# Patient Record
Sex: Female | Born: 1970 | Race: Black or African American | Hispanic: No | Marital: Married | Smoking: Never smoker
Health system: Southern US, Community
[De-identification: ages and names within clinical notes are randomized; demographics above are authoritative.]

## PROBLEM LIST (undated history)

## (undated) DIAGNOSIS — I10 Essential (primary) hypertension: Secondary | ICD-10-CM

---

## 2003-02-19 ENCOUNTER — Other Ambulatory Visit: Admission: RE | Admit: 2003-02-19 | Discharge: 2003-02-19 | Payer: Self-pay | Admitting: Obstetrics and Gynecology

## 2010-05-04 ENCOUNTER — Encounter: Payer: Self-pay | Admitting: Obstetrics and Gynecology

## 2010-05-06 ENCOUNTER — Ambulatory Visit (INDEPENDENT_AMBULATORY_CARE_PROVIDER_SITE_OTHER)
Admission: RE | Admit: 2010-05-06 | Discharge: 2010-05-06 | Payer: Self-pay | Source: Home / Self Care | Attending: Obstetrics and Gynecology | Admitting: Obstetrics and Gynecology

## 2010-05-06 DIAGNOSIS — Z1231 Encounter for screening mammogram for malignant neoplasm of breast: Secondary | ICD-10-CM

## 2011-06-15 ENCOUNTER — Other Ambulatory Visit (HOSPITAL_COMMUNITY)
Admission: RE | Admit: 2011-06-15 | Discharge: 2011-06-15 | Disposition: A | Discharge status: Home / Self Care | Payer: BC Managed Care – PPO | Source: Ambulatory Visit | Attending: Obstetrics and Gynecology | Admitting: Obstetrics and Gynecology

## 2011-06-15 ENCOUNTER — Other Ambulatory Visit: Payer: Self-pay | Admitting: Obstetrics and Gynecology

## 2011-06-15 DIAGNOSIS — Z1231 Encounter for screening mammogram for malignant neoplasm of breast: Secondary | ICD-10-CM

## 2011-06-15 DIAGNOSIS — Z124 Encounter for screening for malignant neoplasm of cervix: Secondary | ICD-10-CM | POA: Insufficient documentation

## 2011-06-22 ENCOUNTER — Ambulatory Visit (HOSPITAL_BASED_OUTPATIENT_CLINIC_OR_DEPARTMENT_OTHER): Payer: Self-pay

## 2013-09-11 ENCOUNTER — Ambulatory Visit (HOSPITAL_BASED_OUTPATIENT_CLINIC_OR_DEPARTMENT_OTHER)
Admission: RE | Admit: 2013-09-11 | Discharge: 2013-09-11 | Disposition: A | Discharge status: Home / Self Care | Payer: BC Managed Care – PPO | Source: Ambulatory Visit | Attending: Obstetrics and Gynecology | Admitting: Obstetrics and Gynecology

## 2013-09-11 ENCOUNTER — Other Ambulatory Visit: Payer: Self-pay | Admitting: Obstetrics and Gynecology

## 2013-09-11 ENCOUNTER — Encounter (INDEPENDENT_AMBULATORY_CARE_PROVIDER_SITE_OTHER): Payer: Self-pay

## 2013-09-11 DIAGNOSIS — Z1231 Encounter for screening mammogram for malignant neoplasm of breast: Secondary | ICD-10-CM | POA: Insufficient documentation

## 2015-01-09 ENCOUNTER — Emergency Department (HOSPITAL_COMMUNITY)
Admission: EM | Admit: 2015-01-09 | Discharge: 2015-01-09 | Disposition: A | Discharge status: Home / Self Care | Payer: BC Managed Care – PPO | Attending: Emergency Medicine | Admitting: Emergency Medicine

## 2015-01-09 ENCOUNTER — Emergency Department (HOSPITAL_COMMUNITY): Payer: BC Managed Care – PPO

## 2015-01-09 ENCOUNTER — Encounter (HOSPITAL_COMMUNITY): Payer: Self-pay | Admitting: Emergency Medicine

## 2015-01-09 DIAGNOSIS — Y9361 Activity, american tackle football: Secondary | ICD-10-CM | POA: Insufficient documentation

## 2015-01-09 DIAGNOSIS — S62636A Displaced fracture of distal phalanx of right little finger, initial encounter for closed fracture: Secondary | ICD-10-CM | POA: Diagnosis not present

## 2015-01-09 DIAGNOSIS — W230XXA Caught, crushed, jammed, or pinched between moving objects, initial encounter: Secondary | ICD-10-CM | POA: Insufficient documentation

## 2015-01-09 DIAGNOSIS — I10 Essential (primary) hypertension: Secondary | ICD-10-CM | POA: Diagnosis not present

## 2015-01-09 DIAGNOSIS — S6991XA Unspecified injury of right wrist, hand and finger(s), initial encounter: Secondary | ICD-10-CM | POA: Diagnosis present

## 2015-01-09 DIAGNOSIS — Y998 Other external cause status: Secondary | ICD-10-CM | POA: Diagnosis not present

## 2015-01-09 DIAGNOSIS — Y9289 Other specified places as the place of occurrence of the external cause: Secondary | ICD-10-CM | POA: Insufficient documentation

## 2015-01-09 DIAGNOSIS — S62639A Displaced fracture of distal phalanx of unspecified finger, initial encounter for closed fracture: Secondary | ICD-10-CM

## 2015-01-09 HISTORY — DX: Essential (primary) hypertension: I10

## 2015-01-09 NOTE — ED Notes (Signed)
Pt c/o right 5 digit injury last Friday, mild deformity noted to same, rates pain 6/10, non radiating, reports numbness to same.

## 2015-01-09 NOTE — Discharge Instructions (Signed)
Please keep splint on you finger for 3-4 weeks. You may use ibuprofen and ice for pain relief. Please follow up with orthopedics in 1 week. Please return to the Emergency Department if symptoms worsen.

## 2015-01-09 NOTE — ED Provider Notes (Signed)
CSN: [id number]     Arrival date & time 01/09/15  1421 History  This chart was scribed for non-physician practitioner, Barrett Henle, PA-C working with Elwin Mocha, MD by Placido Sou, ED scribe. This patient was seen in room WTR6/WTR6 and the patient's care was started at 2:57 PM.   Chief Complaint  Patient presents with  . Finger Injury   The history is provided by the patient. No language interpreter was used.    HPI Comments: Marcia Kemp is a 44 y.o. female who presents to the Emergency Department complaining of constant, moderate, right pinky pain with onset 5 days ago. Pt notes that her finger was jammed with a football while playing catch and due to her pain not subsiding came to the ED for evaluation. Pt notes some associated, mild, swelling which has somewhat subsided. Pt notes a worsening of her pain with movement or palpation. She notes applying ice to the affected region and taking NSAIDs as needed which has provided moderate relief. Denies numbness, tingling. She confirms her medical history. Pt denies any other associated symptoms.   Past Medical History  Diagnosis Date  . Hypertension    History reviewed. No pertinent past surgical history. No family history on file. Social History  Substance Use Topics  . Smoking status: Never Smoker   . Smokeless tobacco: None  . Alcohol Use: No   OB History    No data available     Review of Systems  Musculoskeletal: Positive for joint swelling and arthralgias.  Skin: Negative for color change and wound.   Allergies  Review of patient's allergies indicates no known allergies.  Home Medications   Prior to Admission medications   Not on File   BP 165/87 mmHg  Pulse 60  Temp(Src) 97.6 F (36.4 C) (Oral)  Resp 17  SpO2 96%  LMP 12/30/2014 Physical Exam  Constitutional: She is oriented to person, place, and time. She appears well-developed and well-nourished. No distress.  HENT:  Head: Normocephalic and  atraumatic.  Mouth/Throat: No oropharyngeal exudate.  Neck: Normal range of motion. No tracheal deviation present.  Cardiovascular: Normal rate and intact distal pulses.   Pulses:      Radial pulses are 2+ on the right side.  Pulmonary/Chest: Effort normal. No respiratory distress.  Abdominal: Soft. There is no tenderness.  Musculoskeletal: She exhibits tenderness.  Right 5th digit at DIP joint resting in partially flexed position; no swelling, erythema; mild tenderness at dorsal aspect of DIP joint; DROM of DIP joint due to pain; no bony deformity; FROM of PIP and MCP joints; sensation intact; cap refill less than 2 seconds  Neurological: She is alert and oriented to person, place, and time.  Skin: Skin is warm and dry. She is not diaphoretic.  Psychiatric: She has a normal mood and affect. Her behavior is normal.  Nursing note and vitals reviewed.  ED Course  Procedures  DIAGNOSTIC STUDIES: Oxygen Saturation is 96% on RA, normal by my interpretation.    COORDINATION OF CARE: 3:04 PM Discussed treatment plan with pt at bedside including an x-ray of the affected finger. Pt agreed to plan.  Labs Review Labs Reviewed - No data to display  Imaging Review Dg Finger Little Right  01/09/2015   CLINICAL DATA:  Football injury to the right fifth finger with pain.  EXAM: RIGHT LITTLE FINGER 2+V  COMPARISON:  None.  FINDINGS: There is an intra-articular dorsal plate fracture of the distal phalanx of the right fifth finger, with 2  mm dorsal displacement of the dorsal plate fracture fragment, which comprises approximately 40% of the articular surface. There is subtle lucency at the volar plate of the middle phalanx adjacent to the proximal interphalangeal joint in the right fifth finger on the lateral view, and an nondisplaced volar plate fracture of the middle phalanx cannot be excluded. No dislocation or suspicious focal osseous lesion. Otherwise normal joint spaces.  IMPRESSION: 1. Minimally  displaced intra-articular dorsal plate fracture of the distal phalanx in the right fifth finger. 2. Subtle lucency at the volar plate of the middle phalanx at the PIP joint in the right fifth finger on the lateral view, cannot exclude a nondisplaced middle phalanx volar plate fracture in the right fifth finger.   Electronically Signed   By: Delbert Phenix M.D.   On: 01/09/2015 15:53   I have personally reviewed and evaluated these images and lab results as part of my medical decision-making.  Filed Vitals:   01/09/15 1435  BP: 165/87  Pulse: 60  Temp: 97.6 F (36.4 C)  Resp: 17     MDM   Final diagnoses:  Phalanx, distal fracture of finger, closed, initial encounter   Pt presents with right 5th digit pain s/p injury. Decreased swelling since injury with ice and NSAIDs but pain has remained. VSS. Right 5th DIP joint resting in partially flexed position, DROM of DIP joint due to pain, no swelling, neurovascularly intact. Ice place injured finger. Xray revealed fx of distal phalanx. No evidence of open fracture on exam. Splint placed on finger with DIP and PIP joint in extension. Plan to d/c pt home. Pt given ortho follow up. Pt advised to continue using ice and NASIDs for pain control.   Evaluation does not show pathology requring ongoing emergent intervention or admission. Pt is hemodynamically stable and mentating appropriately. Discussed findings/results and plan with patient/guardian, who agrees with plan. All questions answered. Return precautions discussed and outpatient follow up given.   I personally performed the services described in this documentation, which was scribed in my presence. The recorded information has been reviewed and is accurate.    Satira Sark [place], [place] 01/10/15 1018  Elwin Mocha, MD 01/10/15 [phone]

## 2015-01-09 NOTE — ED Notes (Signed)
Ortho paged for splint 

## 2015-03-15 ENCOUNTER — Other Ambulatory Visit (HOSPITAL_BASED_OUTPATIENT_CLINIC_OR_DEPARTMENT_OTHER): Payer: Self-pay | Admitting: Family Medicine

## 2015-03-15 DIAGNOSIS — Z1231 Encounter for screening mammogram for malignant neoplasm of breast: Secondary | ICD-10-CM

## 2015-03-18 ENCOUNTER — Ambulatory Visit (HOSPITAL_BASED_OUTPATIENT_CLINIC_OR_DEPARTMENT_OTHER)
Admission: RE | Admit: 2015-03-18 | Discharge: 2015-03-18 | Disposition: A | Discharge status: Home / Self Care | Payer: BC Managed Care – PPO | Source: Ambulatory Visit | Attending: Family Medicine | Admitting: Family Medicine

## 2015-03-18 ENCOUNTER — Ambulatory Visit (HOSPITAL_BASED_OUTPATIENT_CLINIC_OR_DEPARTMENT_OTHER): Payer: BC Managed Care – PPO

## 2015-03-18 DIAGNOSIS — Z1231 Encounter for screening mammogram for malignant neoplasm of breast: Secondary | ICD-10-CM | POA: Diagnosis present

## 2016-09-23 ENCOUNTER — Other Ambulatory Visit (HOSPITAL_BASED_OUTPATIENT_CLINIC_OR_DEPARTMENT_OTHER): Payer: Self-pay | Admitting: Family Medicine

## 2016-09-23 DIAGNOSIS — Z1231 Encounter for screening mammogram for malignant neoplasm of breast: Secondary | ICD-10-CM

## 2016-09-24 ENCOUNTER — Ambulatory Visit (HOSPITAL_BASED_OUTPATIENT_CLINIC_OR_DEPARTMENT_OTHER)
Admission: RE | Admit: 2016-09-24 | Discharge: 2016-09-24 | Disposition: A | Discharge status: Home / Self Care | Payer: BC Managed Care – PPO | Source: Ambulatory Visit | Attending: Family Medicine | Admitting: Family Medicine

## 2016-09-24 DIAGNOSIS — Z1231 Encounter for screening mammogram for malignant neoplasm of breast: Secondary | ICD-10-CM

## 2018-04-17 ENCOUNTER — Emergency Department (HOSPITAL_COMMUNITY)
Admission: EM | Admit: 2018-04-17 | Discharge: 2018-04-17 | Disposition: A | Discharge status: Home / Self Care | Payer: BC Managed Care – PPO | Attending: Emergency Medicine | Admitting: Emergency Medicine

## 2018-04-17 ENCOUNTER — Encounter (HOSPITAL_COMMUNITY): Payer: Self-pay | Admitting: *Deleted

## 2018-04-17 ENCOUNTER — Other Ambulatory Visit: Payer: Self-pay

## 2018-04-17 DIAGNOSIS — F419 Anxiety disorder, unspecified: Secondary | ICD-10-CM

## 2018-04-17 DIAGNOSIS — R42 Dizziness and giddiness: Secondary | ICD-10-CM | POA: Diagnosis not present

## 2018-04-17 DIAGNOSIS — R51 Headache: Secondary | ICD-10-CM | POA: Insufficient documentation

## 2018-04-17 DIAGNOSIS — R519 Headache, unspecified: Secondary | ICD-10-CM

## 2018-04-17 LAB — URINALYSIS, ROUTINE W REFLEX MICROSCOPIC
Bilirubin Urine: NEGATIVE
GLUCOSE, UA: NEGATIVE mg/dL
HGB URINE DIPSTICK: NEGATIVE
Ketones, ur: 5 mg/dL — AB
LEUKOCYTES UA: NEGATIVE
Nitrite: NEGATIVE
PH: 5 (ref 5.0–8.0)
PROTEIN: NEGATIVE mg/dL
Specific Gravity, Urine: 1.013 (ref 1.005–1.030)

## 2018-04-17 LAB — CBC WITH DIFFERENTIAL/PLATELET
Abs Immature Granulocytes: 0.02 10*3/uL (ref 0.00–0.07)
BASOS ABS: 0.1 10*3/uL (ref 0.0–0.1)
BASOS PCT: 1 %
EOS ABS: 0.1 10*3/uL (ref 0.0–0.5)
Eosinophils Relative: 1 %
HCT: 35.6 % — ABNORMAL LOW (ref 36.0–46.0)
Hemoglobin: 11.2 g/dL — ABNORMAL LOW (ref 12.0–15.0)
Immature Granulocytes: 0 %
LYMPHS ABS: 2.3 10*3/uL (ref 0.7–4.0)
Lymphocytes Relative: 30 %
MCH: 28.5 pg (ref 26.0–34.0)
MCHC: 31.5 g/dL (ref 30.0–36.0)
MCV: 90.6 fL (ref 80.0–100.0)
Monocytes Absolute: 0.7 10*3/uL (ref 0.1–1.0)
Monocytes Relative: 9 %
NEUTROS PCT: 59 %
NRBC: 0 % (ref 0.0–0.2)
Neutro Abs: 4.7 10*3/uL (ref 1.7–7.7)
PLATELETS: 216 10*3/uL (ref 150–400)
RBC: 3.93 MIL/uL (ref 3.87–5.11)
RDW: 14.3 % (ref 11.5–15.5)
WBC: 7.9 10*3/uL (ref 4.0–10.5)

## 2018-04-17 LAB — BASIC METABOLIC PANEL
ANION GAP: 9 (ref 5–15)
BUN: 11 mg/dL (ref 6–20)
CALCIUM: 9.9 mg/dL (ref 8.9–10.3)
CO2: 26 mmol/L (ref 22–32)
CREATININE: 0.75 mg/dL (ref 0.44–1.00)
Chloride: 104 mmol/L (ref 98–111)
Glucose, Bld: 106 mg/dL — ABNORMAL HIGH (ref 70–99)
Potassium: 3.6 mmol/L (ref 3.5–5.1)
Sodium: 139 mmol/L (ref 135–145)

## 2018-04-17 LAB — I-STAT BETA HCG BLOOD, ED (MC, WL, AP ONLY): I-stat hCG, quantitative: <5 m[IU]/mL (ref <5)

## 2018-04-17 LAB — CK: CK TOTAL: 56 U/L (ref 38–234)

## 2018-04-17 MED ORDER — HYDROXYZINE HCL 25 MG PO TABS: 25.0000 mg | ORAL_TABLET | Freq: Four times a day (QID) | ORAL | 0 refills | Status: AC | PRN

## 2018-04-17 NOTE — ED Provider Notes (Signed)
[facility]-EMERGENCY DEPT Provider Note   CSN: [id number] Arrival date & time: 04/17/18  0569   History   Chief Complaint Chief Complaint  Patient presents with  . Headache  . Dizziness    HPI Marcia Kemp is a 48 y.o. female with past medical history significant for hypertension, anxiety who presents for evaluation of intermittent headaches and dizziness.  Patient states she has been having intermittent headaches, dizziness over the last 6 months.  Patient states she does not have a headache or dizziness at this time.  Was seen by her PCP and was told this was her anxiety causing symptoms.  States she has had intermittent lightheadedness.  Patient states that she thought she saw "sparkling lights" when she turned her head quickly.  Denies fever, chills, nausea, vomiting, vision changes, eye pain, neck stiffness, neck rigidity, chest pain, shortness of breath, cough, abdominal pain, dysuria, diarrhea, constipation, unilateral weakness, facial asymmetry.  Patient states she also has had intermittent "jumping sensations all over my body."  States these have been occurring for the last 8 months.  States she has also had lower extremity cramping.  Has been having normal p.o. intake.  Patient is concerned "I may be dehydrated."  Patient was recently started on trazodone by her PCP because she has not been sleeping well at night.  Does not take anything during the day for anxiety.  History obtained from patient.  No interpreter was used.  HPI  Past Medical History:  Diagnosis Date  . Hypertension     There are no active problems to display for this patient.   History reviewed. No pertinent surgical history.   OB History   No obstetric history on file.      Home Medications    Prior to Admission medications   Medication Sig Start Date End Date Taking? Authorizing Provider  hydrochlorothiazide (HYDRODIURIL) 12.5 MG tablet Take 12.5 mg by mouth daily.  09/25/14    [provider]  hydrOXYzine (ATARAX/VISTARIL) 25 MG tablet Take 1 tablet (25 mg total) by mouth every 6 (six) hours as needed for anxiety. 04/17/18   ,  A, PA-C  ibuprofen (ADVIL,MOTRIN) 200 MG tablet Take 400 mg by mouth every 6 (six) hours as needed for moderate pain.    [provider]  Vitamin D, Ergocalciferol, (DRISDOL) 50000 UNITS CAPS capsule Take 50,000 Units by mouth every 7 (seven) days. Take on Wednesdays. 10/31/14   [provider]    Family History No family history on file.  Social History Social History   Tobacco Use  . Smoking status: Never Smoker  Substance Use Topics  . Alcohol use: No  . Drug use: No     Allergies   Patient has no known allergies.   Review of Systems Review of Systems  Constitutional: Negative.   HENT: Negative.   Respiratory: Negative.   Cardiovascular: Negative.   Gastrointestinal: Negative.   Musculoskeletal:       Diffuse muscle cramping and muscle twitching.  Skin: Negative.   Neurological: Positive for dizziness and headaches. Negative for tremors, seizures, syncope, facial asymmetry, speech difficulty, weakness, light-headedness and numbness.  All other systems reviewed and are negative.    Physical Exam Updated Vital Signs BP (!) 147/76   Pulse (!) 56   Temp 98.2 F (36.8 C) (Oral)   Resp 15   Ht 5\' 2"  (1.575 m)   Wt 57.2 kg   LMP 04/05/2018 (Exact Date)   SpO2 100%   BMI 23.05  kg/m   Physical Exam Vitals signs and nursing note reviewed.  Constitutional:      General: She is not in acute distress.    Appearance: She is well-developed. She is not ill-appearing, toxic-appearing or diaphoretic.  HENT:     Head: Normocephalic and atraumatic.     Mouth/Throat:     Mouth: Mucous membranes are moist.  Eyes:     Pupils: Pupils are equal, round, and reactive to light.     Comments: No horizontal, vertical or rotational nystagmus   Neck:     Musculoskeletal: Normal range of  motion.     Comments: Full active and passive ROM without pain No midline or paraspinal tenderness No nuchal rigidity or meningeal signs  Cardiovascular:     Rate and Rhythm: Normal rate.     Heart sounds: Normal heart sounds. No murmur. No friction rub. No gallop.   Pulmonary:     Effort: Pulmonary effort is normal. No respiratory distress.     Breath sounds: Normal breath sounds.     Comments: Clear to auscultation bilaterally without wheeze, rhonchi or rales. Abdominal:     General: Bowel sounds are normal. There is no distension.     Palpations: Abdomen is soft.     Tenderness: There is no abdominal tenderness.     Comments: Soft, nontender without rebound or guarding.  Musculoskeletal: Normal range of motion.     Comments: Moves all extremities without difficulty.  Gait without ataxia.  Skin:    General: Skin is warm and dry.  Neurological:     Mental Status: She is alert.     Comments: Mental Status:  Alert, oriented, thought content appropriate. Speech fluent without evidence of aphasia. Able to follow 2 step commands without difficulty.  Cranial Nerves:  II:  Peripheral visual fields grossly normal, pupils equal, round, reactive to light III,IV, VI: ptosis not present, extra-ocular motions intact bilaterally  V,VII: smile symmetric, facial light touch sensation equal VIII: hearing grossly normal bilaterally  IX,X: midline uvula rise  XI: bilateral shoulder shrug equal and strong XII: midline tongue extension  Motor:  5/5 in upper and lower extremities bilaterally including strong and equal grip strength and dorsiflexion/plantar flexion Sensory: Pinprick and light touch normal in all extremities.  Deep Tendon Reflexes: 2+ and symmetric  Cerebellar: normal finger-to-nose with bilateral upper extremities Gait: normal gait and balance CV: distal pulses palpable throughout    Psychiatric:     Comments: Patient is anxious on exam.    ED Treatments / Results  Labs (all  labs ordered are listed, but only abnormal results are displayed) Labs Reviewed  CBC WITH DIFFERENTIAL/PLATELET - Abnormal; Notable for the following components:      Result Value   Hemoglobin 11.2 (*)    HCT 35.6 (*)    All other components within normal limits  BASIC METABOLIC PANEL - Abnormal; Notable for the following components:   Glucose, Bld 106 (*)    All other components within normal limits  URINALYSIS, ROUTINE W REFLEX MICROSCOPIC - Abnormal; Notable for the following components:   Color, Urine STRAW (*)    Ketones, ur 5 (*)    All other components within normal limits  CK  I-STAT BETA HCG BLOOD, ED ([facility], [facility], [facility] ONLY)    EKG EKG Interpretation  Date/Time:  Sunday April 17 2018 22:00:23 EST Ventricular Rate:  54 PR Interval:    QRS Duration: 76 QT Interval:  400 QTC Calculation: 379 R Axis:   35 Text  Interpretation:  Sinus rhythm T wave abnormality Baseline wander Abnormal ekg Confirmed by Gerhard MunchLockwood, Robert [phone]([id number]) on 04/17/2018 10:54:59 PM   Radiology No results found.  Procedures Procedures (including critical care time)  Medications Ordered in ED Medications - No data to display   Initial Impression / Assessment and Plan / ED Course  I have reviewed the triage vital signs and the nursing notes.  Pertinent labs & imaging results that were available during my care of the patient were reviewed by me and considered in my medical decision making (see chart for details).  48 year old female who appears otherwise well presents for evaluation of multiple complaints.  Afebrile, nonseptic, non-ill-appearing.  Patient has been having intermittent headaches, dizziness, muscle cramping as well as "muscle jumping."  Patient has been followed by her PCP for the symptoms.  Patient states her primary care provider told her she was having anxiety.  Was also having difficulty sleeping at night was recently started on trazodone.  Patient states she does have a history of anxiety,  panic attacks.  Does not take anything for her symptoms.  Nonfocal neurologic exam without neurologic deficits.  Lungs clear to auscultation bilaterally.  Negative orthostatic VS.  Low suspicion for emergent etiology of symptoms at this time.  She is anxious on exam.  Will obtain screening labs, EKG, urine and reevaluate.  Likely anxiety as cause of symptoms, however patient may benefit from follow-up with neurology secondary to chronic headaches as well as muscle twitching. Headaches are not located in same area with every headache. HA's non concerning for [facility], ICH, Meningitis, or temporal arteritis. Pt is afebrile with no focal neuro deficits, nuchal rigidity, or change in vision. Will reevaluate.  Urinalysis negative, CBC without leukocytosis, hemoglobin 11.2, no prior labs to compare, CK 56, Metabolic panel negative without electrolyte, renal or kidney abnormality, Hcg negative (results not crossing over from mini lab), EKG sinus rhythm.  Low suspicion for emergent pathology at this time causing patient's symptoms.  She is asymptomatic in department at this time.  Recommend follow-up with PCP for reevaluation.  Patient is hemodynamically stable and appropriate for DC home at this time.  Patient is requesting medication for anxiety at this time.  Will DC home with Atarax.  Discussed strict return precautions.  Patient voiced understanding and is agreeable for follow-up.    Final Clinical Impressions(s) / ED Diagnoses   Final diagnoses:  Nonintractable episodic headache, unspecified headache type  Dizziness  Anxiety    ED Discharge Orders         Ordered    hydrOXYzine (ATARAX/VISTARIL) 25 MG tablet  Every 6 hours PRN     04/17/18 2301           ,  A, PA-C 04/17/18 2311    Gerhard MunchLockwood, Robert, MD 04/18/18 0005

## 2018-04-17 NOTE — Discharge Instructions (Addendum)
You were evaluated today for lightheaded, dizziness and headaches.  Your work-up was negative in department.  Follow-up with PCP for reevaluation.  I prescribed you Atarax.  Please take as prescribed.  Please do not drive or operate heavy machinery when you begin taking this medicine.  Return to the ED for any worsening symptoms.

## 2018-04-17 NOTE — ED Triage Notes (Signed)
Pt c/o headache x 2-3 days and feeling lightheaded.  Pt stated "today I saw little sparkling lights."  Pt denies n/v.

## 2019-04-10 ENCOUNTER — Other Ambulatory Visit (HOSPITAL_BASED_OUTPATIENT_CLINIC_OR_DEPARTMENT_OTHER): Payer: Self-pay | Admitting: Nurse Practitioner

## 2019-04-10 DIAGNOSIS — Z1231 Encounter for screening mammogram for malignant neoplasm of breast: Secondary | ICD-10-CM

## 2019-04-11 ENCOUNTER — Other Ambulatory Visit: Payer: Self-pay

## 2019-04-11 ENCOUNTER — Ambulatory Visit (HOSPITAL_BASED_OUTPATIENT_CLINIC_OR_DEPARTMENT_OTHER)
Admission: RE | Admit: 2019-04-11 | Discharge: 2019-04-11 | Disposition: A | Discharge status: Home / Self Care | Payer: BC Managed Care – PPO | Source: Ambulatory Visit | Attending: Nurse Practitioner | Admitting: Nurse Practitioner

## 2019-04-11 DIAGNOSIS — Z1231 Encounter for screening mammogram for malignant neoplasm of breast: Secondary | ICD-10-CM

## 2019-06-10 ENCOUNTER — Ambulatory Visit: Payer: BC Managed Care – PPO | Attending: Internal Medicine

## 2019-06-10 DIAGNOSIS — Z23 Encounter for immunization: Secondary | ICD-10-CM

## 2019-06-10 NOTE — Progress Notes (Signed)
   Covid-19 Vaccination Clinic  Name:  Emalea Mix    MRN: [id number] DOB: 1970/07/15  06/10/2019  Ms. Bernasconi was observed post Covid-19 immunization for 15 minutes without incidence. She was provided with Vaccine Information Sheet and instruction to access the V-Safe system.   Ms. Bonet was instructed to call 911 with any severe reactions post vaccine: Marland Kitchen Difficulty breathing  . Swelling of your face and throat  . A fast heartbeat  . A bad rash all over your body  . Dizziness and weakness    Immunizations Administered    Name Date Dose VIS Date Route   Pfizer COVID-19 Vaccine 06/10/2019  4:16 PM 0.3 mL 03/24/2019 Intramuscular   Manufacturer: [employer], [employer]   Lot: [id number]   NDC: [id number]

## 2019-07-01 ENCOUNTER — Ambulatory Visit: Payer: BC Managed Care – PPO | Attending: Internal Medicine

## 2019-07-01 DIAGNOSIS — Z23 Encounter for immunization: Secondary | ICD-10-CM

## 2019-07-01 NOTE — Progress Notes (Signed)
   Covid-19 Vaccination Clinic  Name:  Marcia Kemp    MRN: [id number] DOB: 1970/08/09  07/01/2019  [name] was observed post Covid-19 immunization for 15 minutes without incident. She was provided with Vaccine Information Sheet and instruction to access the V-Safe system.   Ms. Down was instructed to call 911 with any severe reactions post vaccine: Marland Kitchen Difficulty breathing  . Swelling of face and throat  . A fast heartbeat  . A bad rash all over body  . Dizziness and weakness   Immunizations Administered    Name Date Dose VIS Date Route   Pfizer COVID-19 Vaccine 07/01/2019  4:11 PM 0.3 mL 03/24/2019 Intramuscular   Manufacturer: [employer], [employer]   Lot: [id number]   NDC: [address]-7

## 2019-08-16 ENCOUNTER — Ambulatory Visit: Payer: BC Managed Care – PPO | Attending: Internal Medicine

## 2019-08-16 DIAGNOSIS — Z20822 Contact with and (suspected) exposure to covid-19: Secondary | ICD-10-CM

## 2019-08-17 LAB — SARS-COV-2, NAA 2 DAY TAT

## 2019-08-17 LAB — NOVEL CORONAVIRUS, NAA: SARS-CoV-2, NAA: NOT DETECTED

## 2020-09-15 IMAGING — MG DIGITAL SCREENING BILAT W/ TOMO W/ CAD
8 series · 8 of 24 positions shown · non-contrast
Comparison: Previous exam(s).

CLINICAL DATA: Screening.

EXAM:
DIGITAL SCREENING BILATERAL MAMMOGRAM WITH TOMO AND CAD

[L MLO synth-2D]
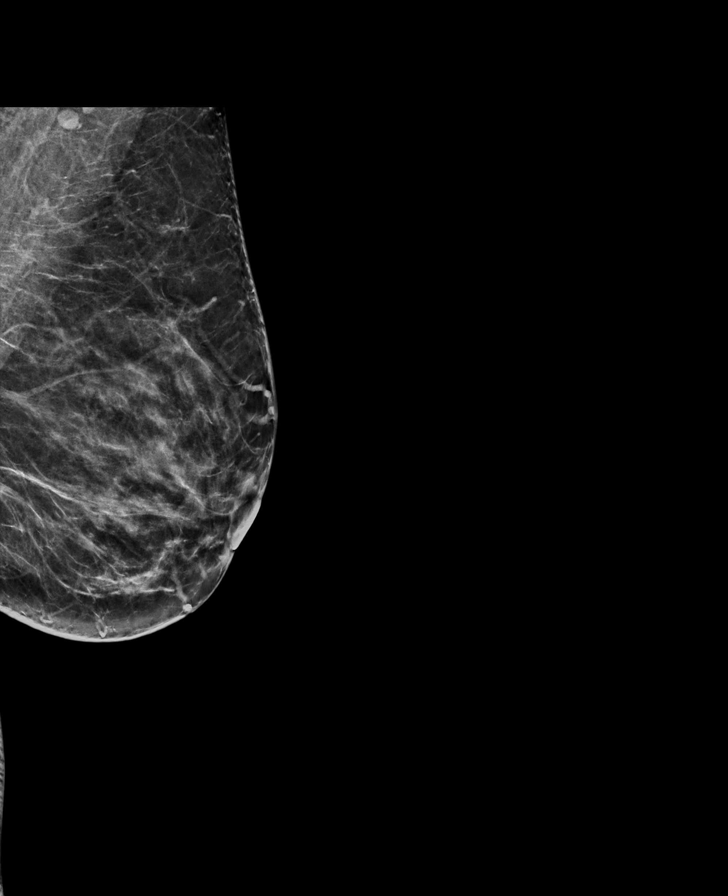

[R CC synth-2D]
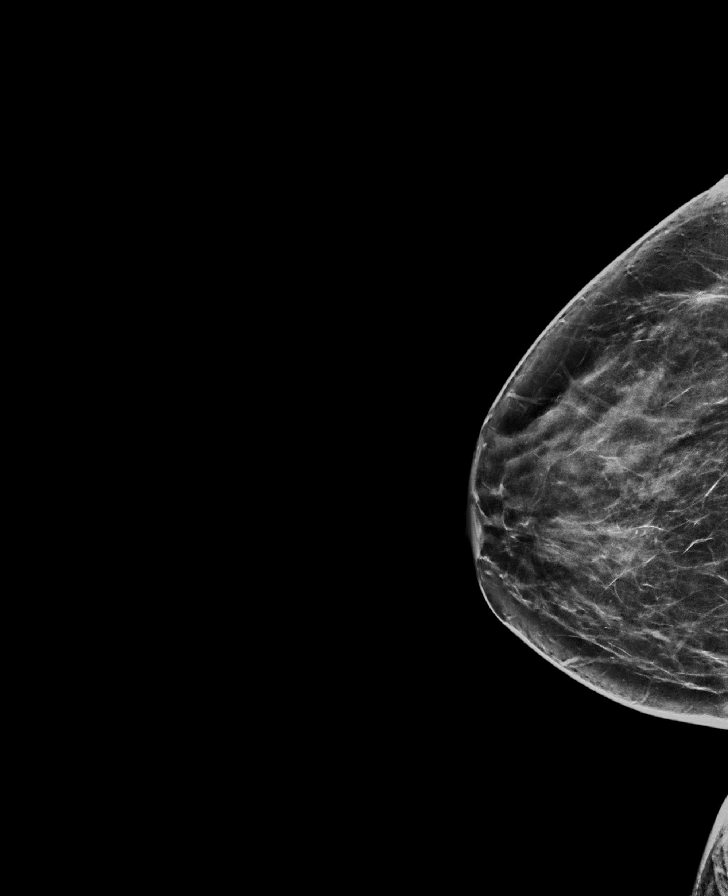

[L CC synth-2D]
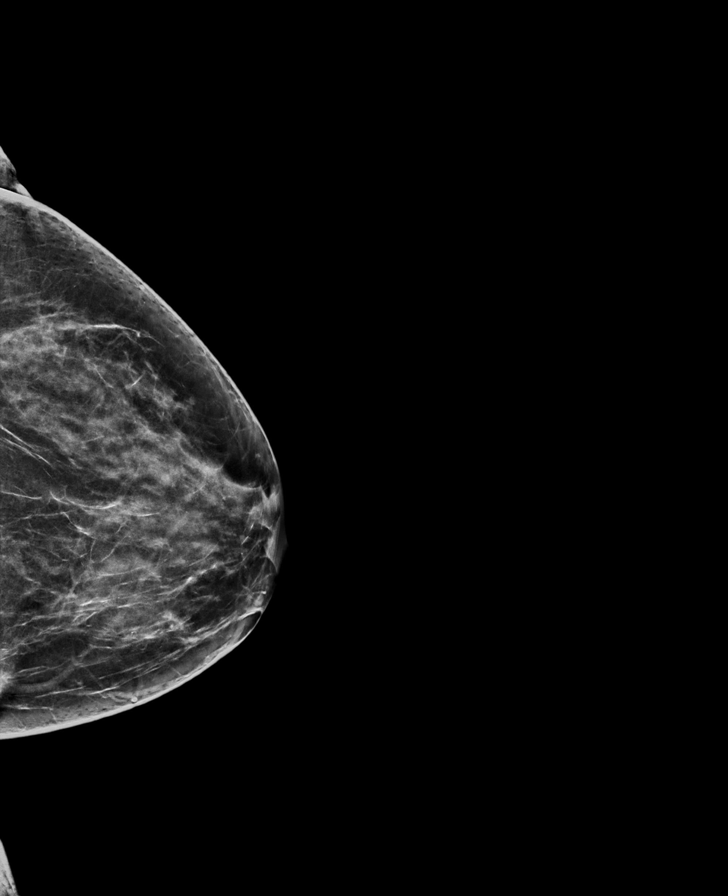

[R MLO synth-2D]
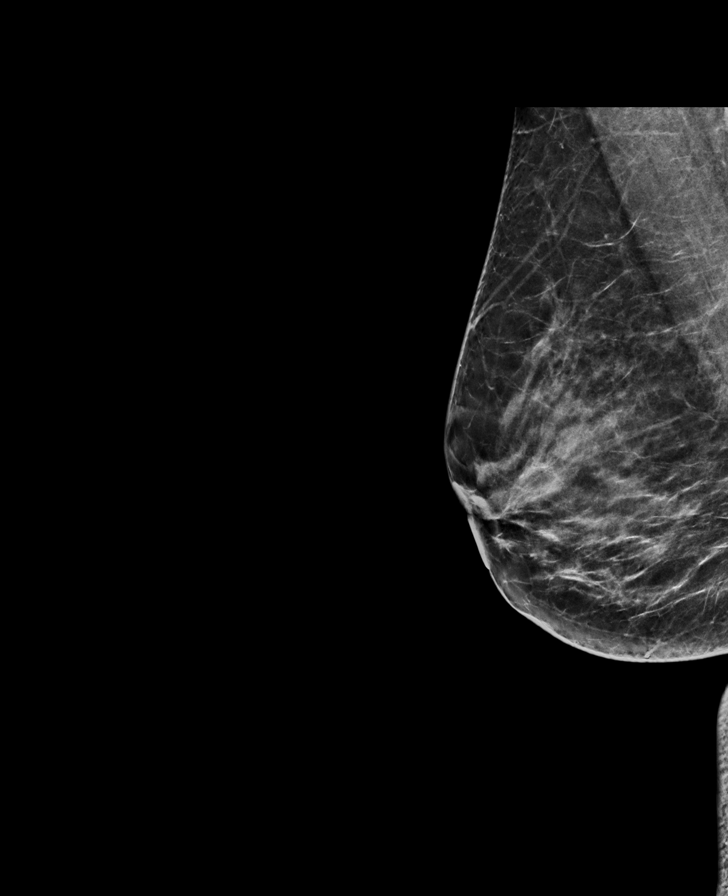

[L CC tomo · tomo slice 33/65.0]
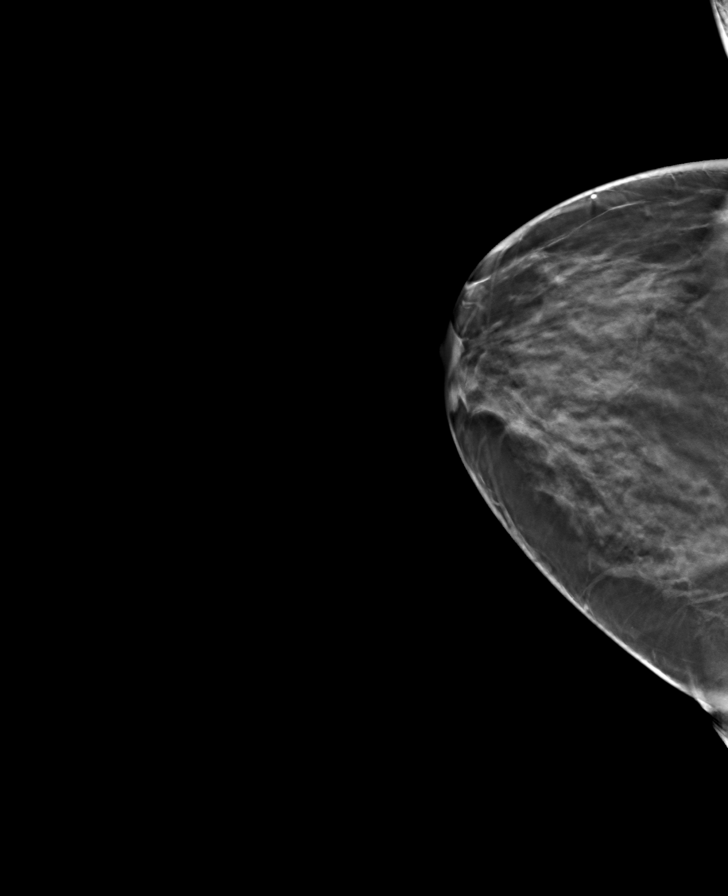

[L MLO tomo · tomo slice 33/66.0]
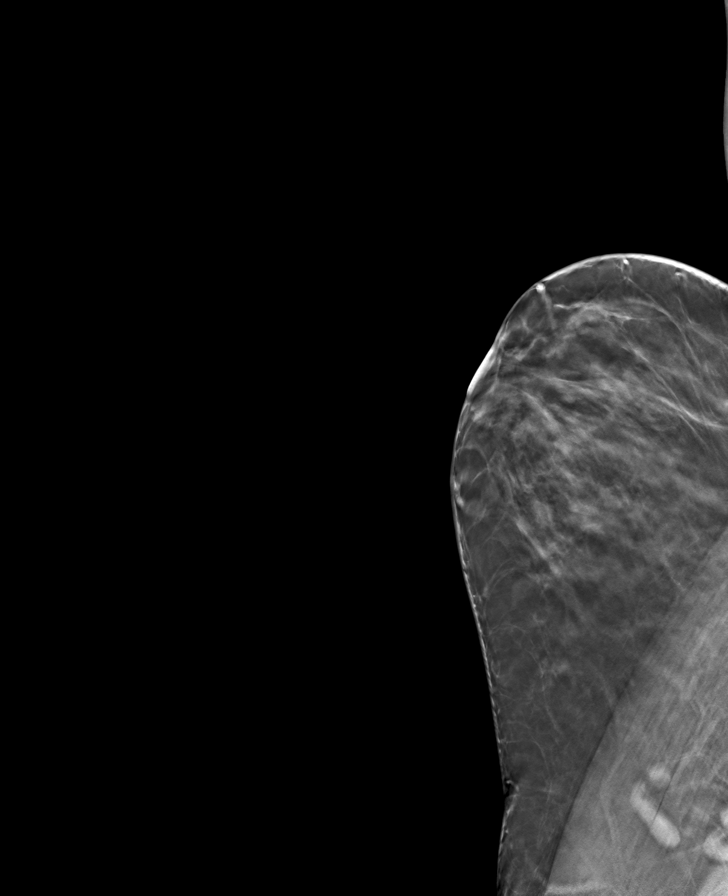

[R MLO tomo · tomo slice 33/64.0]
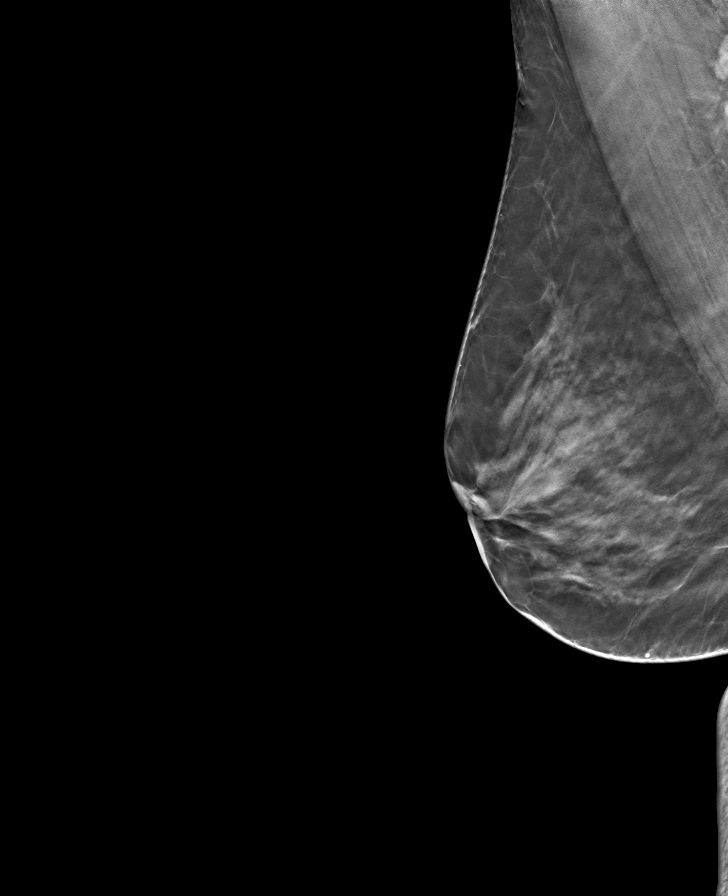

[R CC tomo · tomo slice 34/67.0]
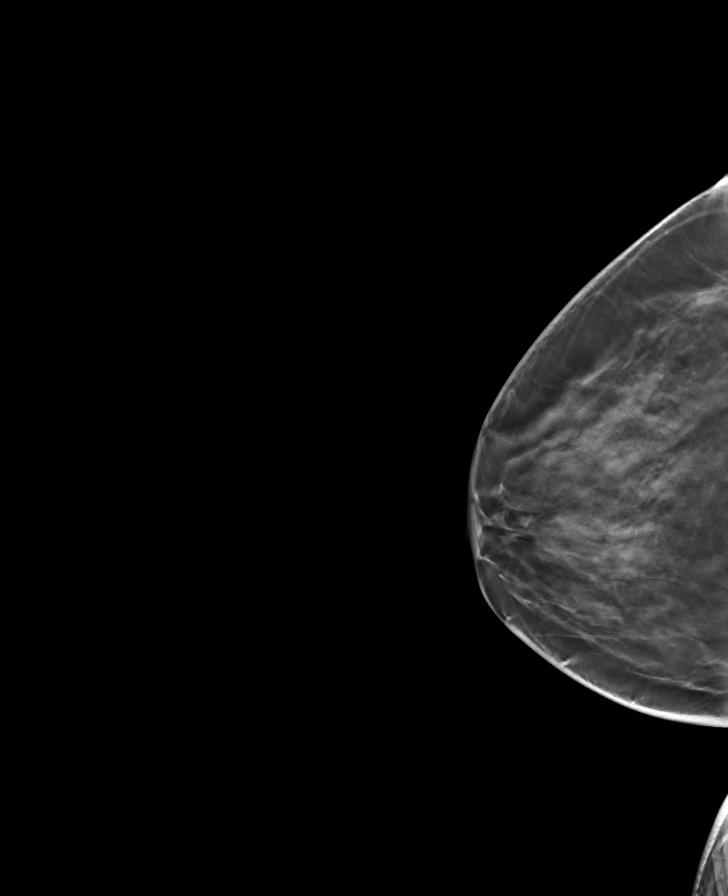

[8 of 24 positions shown; findings below may reference images not displayed]

ACR Breast Density Category c: The breast tissue is heterogeneously
dense, which may obscure small masses.
FINDINGS: There are no findings suspicious for malignancy. Images were
processed with CAD.
IMPRESSION: No mammographic evidence of malignancy. A result letter of this
screening mammogram will be mailed directly to the patient.

RECOMMENDATION:
Screening mammogram in one year. (Code:[id number]-U-LHB)

BI-RADS CATEGORY  1: Negative.
